# Patient Record
Sex: Male | Born: 1945
Health system: Southern US, Community
[De-identification: ages and names within clinical notes are randomized; demographics above are authoritative.]

## PROBLEM LIST (undated history)

## (undated) DIAGNOSIS — S43006A Unspecified dislocation of unspecified shoulder joint, initial encounter: Secondary | ICD-10-CM

---

## 2005-01-24 ENCOUNTER — Inpatient Hospital Stay (HOSPITAL_COMMUNITY): Admission: EM | Admit: 2005-01-24 | Discharge: 2005-01-24 | Payer: Self-pay | Admitting: Emergency Medicine

## 2014-05-13 ENCOUNTER — Emergency Department (HOSPITAL_COMMUNITY): Payer: Medicare Other

## 2014-05-13 ENCOUNTER — Encounter (HOSPITAL_COMMUNITY): Payer: Self-pay | Admitting: *Deleted

## 2014-05-13 ENCOUNTER — Emergency Department (HOSPITAL_COMMUNITY)
Admission: EM | Admit: 2014-05-13 | Discharge: 2014-05-13 | Disposition: A | Discharge status: Home / Self Care | Payer: Medicare Other | Attending: Emergency Medicine | Admitting: Emergency Medicine

## 2014-05-13 DIAGNOSIS — Y9389 Activity, other specified: Secondary | ICD-10-CM | POA: Insufficient documentation

## 2014-05-13 DIAGNOSIS — Y998 Other external cause status: Secondary | ICD-10-CM | POA: Insufficient documentation

## 2014-05-13 DIAGNOSIS — S82432A Displaced oblique fracture of shaft of left fibula, initial encounter for closed fracture: Secondary | ICD-10-CM | POA: Diagnosis not present

## 2014-05-13 DIAGNOSIS — F10920 Alcohol use, unspecified with intoxication, uncomplicated: Secondary | ICD-10-CM

## 2014-05-13 DIAGNOSIS — W1839XA Other fall on same level, initial encounter: Secondary | ICD-10-CM | POA: Insufficient documentation

## 2014-05-13 DIAGNOSIS — W19XXXA Unspecified fall, initial encounter: Secondary | ICD-10-CM

## 2014-05-13 DIAGNOSIS — Y9289 Other specified places as the place of occurrence of the external cause: Secondary | ICD-10-CM | POA: Insufficient documentation

## 2014-05-13 DIAGNOSIS — S82892A Other fracture of left lower leg, initial encounter for closed fracture: Secondary | ICD-10-CM

## 2014-05-13 DIAGNOSIS — S99912A Unspecified injury of left ankle, initial encounter: Secondary | ICD-10-CM | POA: Diagnosis present

## 2014-05-13 DIAGNOSIS — F10120 Alcohol abuse with intoxication, uncomplicated: Secondary | ICD-10-CM | POA: Insufficient documentation

## 2014-05-13 HISTORY — DX: Unspecified dislocation of unspecified shoulder joint, initial encounter: S43.006A

## 2014-05-13 LAB — CBC WITH DIFFERENTIAL/PLATELET
Basophils Absolute: 0 10*3/uL (ref 0.0–0.1)
Basophils Relative: 0 % (ref 0–1)
EOS PCT: 0 % (ref 0–5)
Eosinophils Absolute: 0 10*3/uL (ref 0.0–0.7)
HEMATOCRIT: 43.2 % (ref 39.0–52.0)
Hemoglobin: 14.5 g/dL (ref 13.0–17.0)
LYMPHS ABS: 1.9 10*3/uL (ref 0.7–4.0)
Lymphocytes Relative: 18 % (ref 12–46)
MCH: 30.1 pg (ref 26.0–34.0)
MCHC: 33.6 g/dL (ref 30.0–36.0)
MCV: 89.6 fL (ref 78.0–100.0)
MONO ABS: 0.5 10*3/uL (ref 0.1–1.0)
MONOS PCT: 5 % (ref 3–12)
NEUTROS ABS: 7.8 10*3/uL — AB (ref 1.7–7.7)
NEUTROS PCT: 77 % (ref 43–77)
PLATELETS: 242 10*3/uL (ref 150–400)
RBC: 4.82 MIL/uL (ref 4.22–5.81)
RDW: 12.8 % (ref 11.5–15.5)
WBC: 10.2 10*3/uL (ref 4.0–10.5)

## 2014-05-13 LAB — COMPREHENSIVE METABOLIC PANEL
ALK PHOS: 65 U/L (ref 39–117)
ALT: 50 U/L (ref 0–53)
ANION GAP: 10 (ref 5–15)
AST: 34 U/L (ref 0–37)
Albumin: 3.7 g/dL (ref 3.5–5.2)
BUN: 12 mg/dL (ref 6–23)
CO2: 24 mmol/L (ref 19–32)
Calcium: 8.8 mg/dL (ref 8.4–10.5)
Chloride: 102 mEq/L (ref 96–112)
Creatinine, Ser: 1.2 mg/dL (ref 0.50–1.35)
GFR calc Af Amer: 71 mL/min — ABNORMAL LOW (ref >90)
GFR, EST NON AFRICAN AMERICAN: 61 mL/min — AB (ref >90)
Glucose, Bld: 170 mg/dL — ABNORMAL HIGH (ref 70–99)
Potassium: 3.6 mmol/L (ref 3.5–5.1)
Sodium: 136 mmol/L (ref 135–145)
Total Bilirubin: 0.2 mg/dL — ABNORMAL LOW (ref 0.3–1.2)
Total Protein: 6.9 g/dL (ref 6.0–8.3)

## 2014-05-13 LAB — ETHANOL: Alcohol, Ethyl (B): 206 mg/dL — ABNORMAL HIGH (ref 0–9)

## 2014-05-13 MED ORDER — ONDANSETRON HCL 4 MG/2ML IJ SOLN
4.0000 mg | Freq: Once | INTRAMUSCULAR | Status: AC
  Administered 2014-05-13: 4 mg via INTRAVENOUS
  Filled 2014-05-13: qty 2

## 2014-05-13 MED ORDER — HYDROMORPHONE HCL 1 MG/ML IJ SOLN
0.5000 mg | Freq: Once | INTRAMUSCULAR | Status: AC
  Administered 2014-05-13: 0.5 mg via INTRAVENOUS
  Filled 2014-05-13: qty 1

## 2014-05-13 MED ORDER — OXYCODONE-ACETAMINOPHEN 5-325 MG PO TABS: 1.0000 | ORAL_TABLET | Freq: Four times a day (QID) | ORAL | Status: AC | PRN

## 2014-05-13 MED ORDER — LORAZEPAM 2 MG/ML IJ SOLN
0.5000 mg | Freq: Once | INTRAMUSCULAR | Status: AC
  Administered 2014-05-13: 0.5 mg via INTRAVENOUS
  Filled 2014-05-13: qty 1

## 2014-05-13 MED ORDER — SODIUM CHLORIDE 0.9 % IV BOLUS (SEPSIS)
1000.0000 mL | INTRAVENOUS | Status: AC
  Administered 2014-05-13: 1000 mL via INTRAVENOUS

## 2014-05-13 MED ORDER — FENTANYL CITRATE 0.05 MG/ML IJ SOLN
50.0000 ug | Freq: Once | INTRAMUSCULAR | Status: AC
  Administered 2014-05-13: 50 ug via INTRAVENOUS
  Filled 2014-05-13: qty 2

## 2014-05-13 NOTE — ED Notes (Signed)
$  212 given to family, along with keys and credit cards.

## 2014-05-13 NOTE — Progress Notes (Signed)
Orthopedic Tech Progress Note Patient Details:  Brian Everett 04-26-1946 [id number] Post. SLS with stirrup applied after reduction of LLE by Dr. Tiburcio PeaHarris. Application of splint tolerated well. Capillary refill normal before and after placement of splint. Crutches fit for patient's height. Patient unable to get up for crtuch training at this time. Nursing staff to call Ortho Tech back should patient need additional help with crutches. Ortho Devices Type of Ortho Device: Crutches, Ace wrap, Post (short) splint, Stirrup splint Ortho Device/Splint Location: LLE Ortho Device/Splint Interventions: Application   [name] 05/13/2014, 11:54 AM

## 2014-05-13 NOTE — ED Notes (Signed)
Pt to xray

## 2014-05-13 NOTE — ED Notes (Signed)
Dr. Romeo AppleHarrison and Ortho at bedside.

## 2014-05-13 NOTE — ED Notes (Signed)
Patient to CT.
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
## 2014-05-13 NOTE — ED Notes (Signed)
Pt returned.
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
## 2016-03-29 IMAGING — CR DG PELVIS 1-2V
1 series · 1 of 1 positions shown · non-contrast
Comparison: none

CLINICAL DATA: Fall.  Initial encounter

EXAM:
PELVIS - 1-2 VIEW

[pelvis ap]
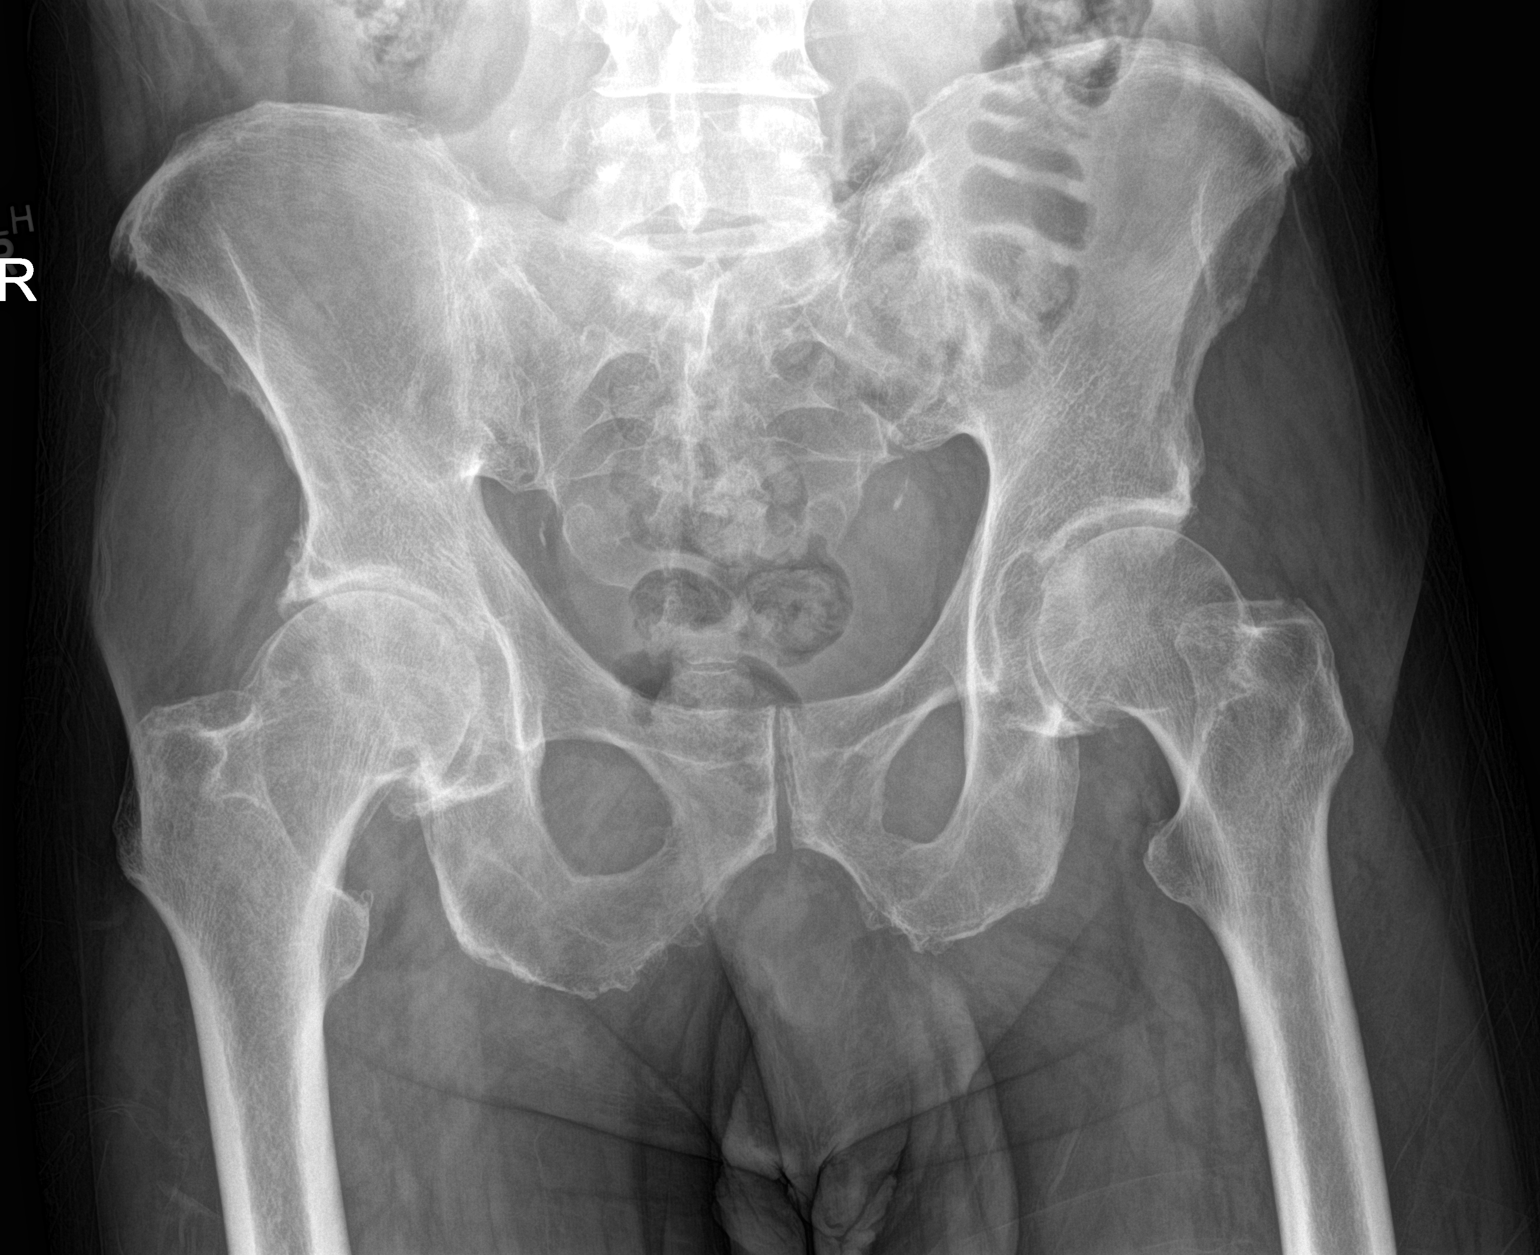

[1 of 1 positions shown; findings below may reference images not displayed]

FINDINGS: Moderate to advanced degenerative change in the right hip joint with
joint space narrowing, spurring, and cystic change in the femoral
head. Left hip joint normal.

Negative for fracture.
IMPRESSION: Moderate to advanced osteoarthritis in the right hip joint. Negative
for fracture..

## 2020-01-09 ENCOUNTER — Other Ambulatory Visit: Payer: Self-pay

## 2020-01-09 ENCOUNTER — Encounter (HOSPITAL_COMMUNITY): Payer: Self-pay

## 2020-01-09 ENCOUNTER — Emergency Department (HOSPITAL_COMMUNITY)
Admission: EM | Admit: 2020-01-09 | Discharge: 2020-01-09 | Disposition: A | Discharge status: Home / Self Care | Payer: Medicare Other | Attending: Emergency Medicine | Admitting: Emergency Medicine

## 2020-01-09 DIAGNOSIS — F10129 Alcohol abuse with intoxication, unspecified: Secondary | ICD-10-CM | POA: Insufficient documentation

## 2020-01-09 DIAGNOSIS — F10920 Alcohol use, unspecified with intoxication, uncomplicated: Secondary | ICD-10-CM

## 2020-01-09 NOTE — Discharge Instructions (Addendum)
Return to the ER if you start to experience chest pain, shortness of breath, numbness in arms or legs, blurry vision or headache.
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
## 2020-11-08 ENCOUNTER — Emergency Department (HOSPITAL_COMMUNITY)
Admission: EM | Admit: 2020-11-08 | Discharge: 2020-11-09 | Disposition: A | Discharge status: Home / Self Care | Payer: Medicare Other | Attending: Emergency Medicine | Admitting: Emergency Medicine

## 2020-11-08 ENCOUNTER — Other Ambulatory Visit: Payer: Self-pay

## 2020-11-08 DIAGNOSIS — Z743 Need for continuous supervision: Secondary | ICD-10-CM | POA: Diagnosis not present

## 2020-11-08 DIAGNOSIS — I499 Cardiac arrhythmia, unspecified: Secondary | ICD-10-CM | POA: Diagnosis not present

## 2020-11-08 DIAGNOSIS — R410 Disorientation, unspecified: Secondary | ICD-10-CM | POA: Diagnosis not present

## 2020-11-08 DIAGNOSIS — F10920 Alcohol use, unspecified with intoxication, uncomplicated: Secondary | ICD-10-CM

## 2020-11-08 DIAGNOSIS — R404 Transient alteration of awareness: Secondary | ICD-10-CM | POA: Diagnosis not present

## 2020-11-08 DIAGNOSIS — F10929 Alcohol use, unspecified with intoxication, unspecified: Secondary | ICD-10-CM | POA: Diagnosis present
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
## 2022-01-11 ENCOUNTER — Ambulatory Visit: Payer: Self-pay

## 2022-01-11 NOTE — Patient Outreach (Signed)
  Care Coordination   01/11/2022 Name: Brian Everett MRN: [id number] DOB: 08-Sep-1945   Care Coordination Outreach Attempts:  An unsuccessful telephone outreach was attempted today to offer the patient information about available care coordination services as a benefit of their health plan.   Follow Up Plan:  Additional outreach attempts will be made to offer the patient care coordination information and services.   Encounter Outcome:  No Answer  Care Coordination Interventions Activated:  No   Care Coordination Interventions:  No, not indicated    [facility] Care Management [phone]

## 2022-03-02 ENCOUNTER — Telehealth: Payer: Self-pay

## 2022-03-02 NOTE — Patient Outreach (Signed)
  Care Coordination   03/02/2022 Name: Brian Everett MRN: [id number] DOB: [birth year]   Care Coordination Outreach Attempts:  A second unsuccessful outreach was attempted today to offer the patient with information about available care coordination services as a benefit of their health plan.     Follow Up Plan:  Additional outreach attempts will be made to offer the patient care coordination information and services.   Encounter Outcome:  No Answer  Care Coordination Interventions Activated:  No   Care Coordination Interventions:  No, not indicated    [place] Management [phone]
